# Patient Record
Sex: Female | Born: 1950 | Race: Black or African American | Hispanic: No | Marital: Single | Smoking: Never smoker
Health system: Southern US, Community
[De-identification: ages and names within clinical notes are randomized; demographics above are authoritative.]

## PROBLEM LIST (undated history)

## (undated) DIAGNOSIS — H579 Unspecified disorder of eye and adnexa: Secondary | ICD-10-CM

## (undated) DIAGNOSIS — I10 Essential (primary) hypertension: Secondary | ICD-10-CM

## (undated) DIAGNOSIS — E119 Type 2 diabetes mellitus without complications: Secondary | ICD-10-CM

## (undated) HISTORY — PX: KNEE SURGERY: SHX244

---

## 2015-07-23 ENCOUNTER — Emergency Department (INDEPENDENT_AMBULATORY_CARE_PROVIDER_SITE_OTHER)
Admission: EM | Admit: 2015-07-23 | Discharge: 2015-07-23 | Disposition: A | Discharge status: Home / Self Care | Payer: PRIVATE HEALTH INSURANCE | Source: Home / Self Care

## 2015-07-23 ENCOUNTER — Encounter (HOSPITAL_COMMUNITY): Payer: Self-pay | Admitting: *Deleted

## 2015-07-23 ENCOUNTER — Emergency Department (INDEPENDENT_AMBULATORY_CARE_PROVIDER_SITE_OTHER): Payer: PRIVATE HEALTH INSURANCE

## 2015-07-23 DIAGNOSIS — J0101 Acute recurrent maxillary sinusitis: Secondary | ICD-10-CM

## 2015-07-23 HISTORY — DX: Unspecified disorder of eye and adnexa: H57.9

## 2015-07-23 HISTORY — DX: Type 2 diabetes mellitus without complications: E11.9

## 2015-07-23 HISTORY — DX: Essential (primary) hypertension: I10

## 2015-07-23 MED ORDER — AZITHROMYCIN 250 MG PO TABS: ORAL_TABLET | ORAL | Status: AC

## 2015-07-23 MED ORDER — IPRATROPIUM BROMIDE 0.06 % NA SOLN: 2.0000 | Freq: Four times a day (QID) | NASAL | Status: AC

## 2015-07-23 MED ORDER — GUAIFENESIN-CODEINE 100-10 MG/5ML PO SYRP: 10.0000 mL | ORAL_SOLUTION | Freq: Four times a day (QID) | ORAL | Status: AC | PRN

## 2015-07-23 NOTE — Discharge Instructions (Signed)
Drink plenty of fluids as discussed, use medicine as prescribed, and mucinex or delsym for cough. Return or see your doctor if further problems °

## 2015-07-23 NOTE — ED Notes (Signed)
Pt  Reports    Symptoms  Of  Cough  /  Congestion       X  1  Week      Cough  Is  Become  More  Productive    Pt  Resides       In  Another     [facility]

## 2015-07-23 NOTE — ED Provider Notes (Signed)
CSN: [id number]     Arrival date & time 07/23/15  1527 History   None    Chief Complaint  Patient presents with  . URI   (Consider location/radiation/quality/duration/timing/severity/associated sxs/prior Treatment) Patient is a 64 y.o. female presenting with URI. The history is provided by the patient.  URI Presenting symptoms: congestion, cough and rhinorrhea   Presenting symptoms: no fever   Severity:  Moderate Onset quality:  Gradual Duration:  1 week Progression:  Unchanged (is from [place] , [place], usually gets abx and cough med.) Chronicity:  New Worsened by:  Nothing tried Ineffective treatments:  None tried Risk factors: recent travel   Risk factors: no sick contacts     Past Medical History  Diagnosis Date  . Hypertension   . Diabetes mellitus without complication (HCC)   . Eye disease    Past Surgical History  Procedure Laterality Date  . Knee surgery     History reviewed. No pertinent family history. Social History  Substance Use Topics  . Smoking status: Never Smoker   . Smokeless tobacco: None  . Alcohol Use: Yes   OB History    No data available     Review of Systems  Constitutional: Negative.  Negative for fever.  HENT: Positive for congestion, postnasal drip and rhinorrhea.   Respiratory: Positive for cough.   Cardiovascular: Negative.   Gastrointestinal: Negative.   All other systems reviewed and are negative.   Allergies  Review of patient's allergies indicates not on file.  Home Medications   Prior to Admission medications   Medication Sig Start Date End Date Taking? Authorizing Provider  azithromycin (ZITHROMAX Z-PAK) 250 MG tablet Take as directed on pack 07/23/15   Linna Hoff, MD  Brimonidine Tartrate-Timolol (COMBIGAN OP) Apply to eye.   Yes Historical Provider, MD  CLONAZEPAM PO Take by mouth.   Yes Historical Provider, MD  Furosemide (LASIX PO) Take by mouth.   Yes Historical Provider, MD  guaiFENesin-codeine (ROBITUSSIN AC)  100-10 MG/5ML syrup Take 10 mLs by mouth 4 (four) times daily as needed for cough. 07/23/15   Linna Hoff, MD  ipratropium (ATROVENT) 0.06 % nasal spray Place 2 sprays into both nostrils 4 (four) times daily. 07/23/15   Linna Hoff, MD  LOSARTAN POTASSIUM PO Take by mouth.   Yes Historical Provider, MD  METFORMIN HCL PO Take by mouth.   Yes Historical Provider, MD   Meds Ordered and Administered this Visit  Medications - No data to display  BP 159/81 mmHg  Pulse 62  Temp(Src) 97.8 F (36.6 C) (Oral)  Resp 16  SpO2 99% No data found.   Physical Exam  Constitutional: She is oriented to person, place, and time. She appears well-developed and well-nourished. No distress.  HENT:  Right Ear: External ear normal.  Left Ear: External ear normal.  Nose: Mucosal edema and rhinorrhea present. No sinus tenderness.  Mouth/Throat: Oropharynx is clear and moist.  Neck: Normal range of motion. Neck supple.  Cardiovascular: Normal rate and normal heart sounds.   Pulmonary/Chest: Effort normal and breath sounds normal. She has no rales.  Lymphadenopathy:    She has no cervical adenopathy.  Neurological: She is alert and oriented to person, place, and time.  Skin: Skin is warm and dry.  Nursing note and vitals reviewed.   ED Course  Procedures (including critical care time)  Labs Review Labs Reviewed - No data to display  Imaging Review Dg Chest 2 View  07/23/2015  CLINICAL DATA:  Congestion with productive cough. No fever. History of bronchitis and diabetes. Initial encounter. EXAM: CHEST  2 VIEW COMPARISON:  None. FINDINGS: The suboptimal inspiration on the lateral view. The heart size and mediastinal contours are normal. The lungs are clear. There is no pleural effusion or pneumothorax. No acute osseous findings are identified. Mild extrapleural fat deposition is noted symmetrically at both lung apices. IMPRESSION: No active cardiopulmonary process. Electronically Signed   By: Carey BullocksWilliam   Veazey M.D.   On: 07/23/2015 17:09   X-rays reviewed and report per radiologist.   Visual Acuity Review  Right Eye Distance:   Left Eye Distance:   Bilateral Distance:    Right Eye Near:   Left Eye Near:    Bilateral Near:         MDM   1. Acute recurrent maxillary sinusitis        Linna HoffJames D Kindl, MD 07/24/15 2012

## 2017-06-09 IMAGING — DX DG CHEST 2V
2 series · 2 of 2 positions shown · non-contrast
Comparison: None.

CLINICAL DATA: Congestion with productive cough. No fever. History
of bronchitis and diabetes. Initial encounter.

EXAM:
CHEST  2 VIEW

[chest pa]
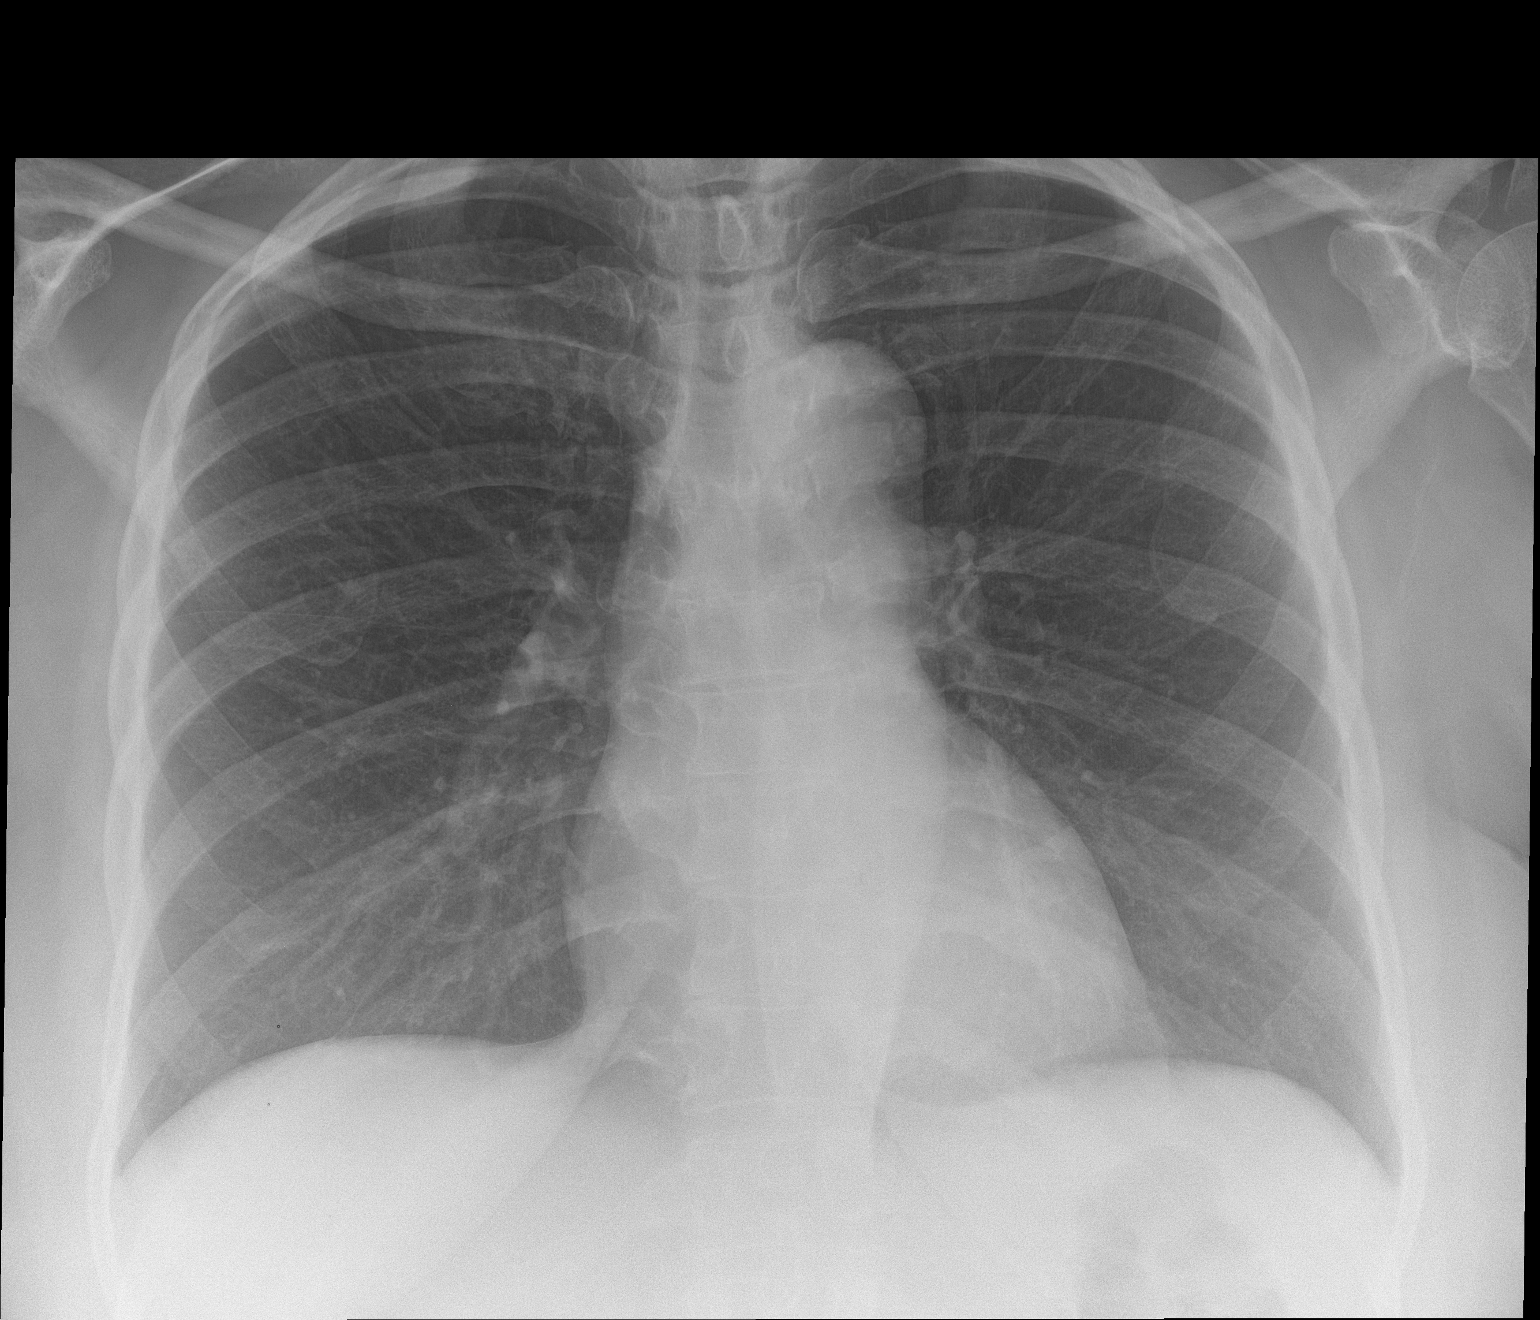

[chest lat]
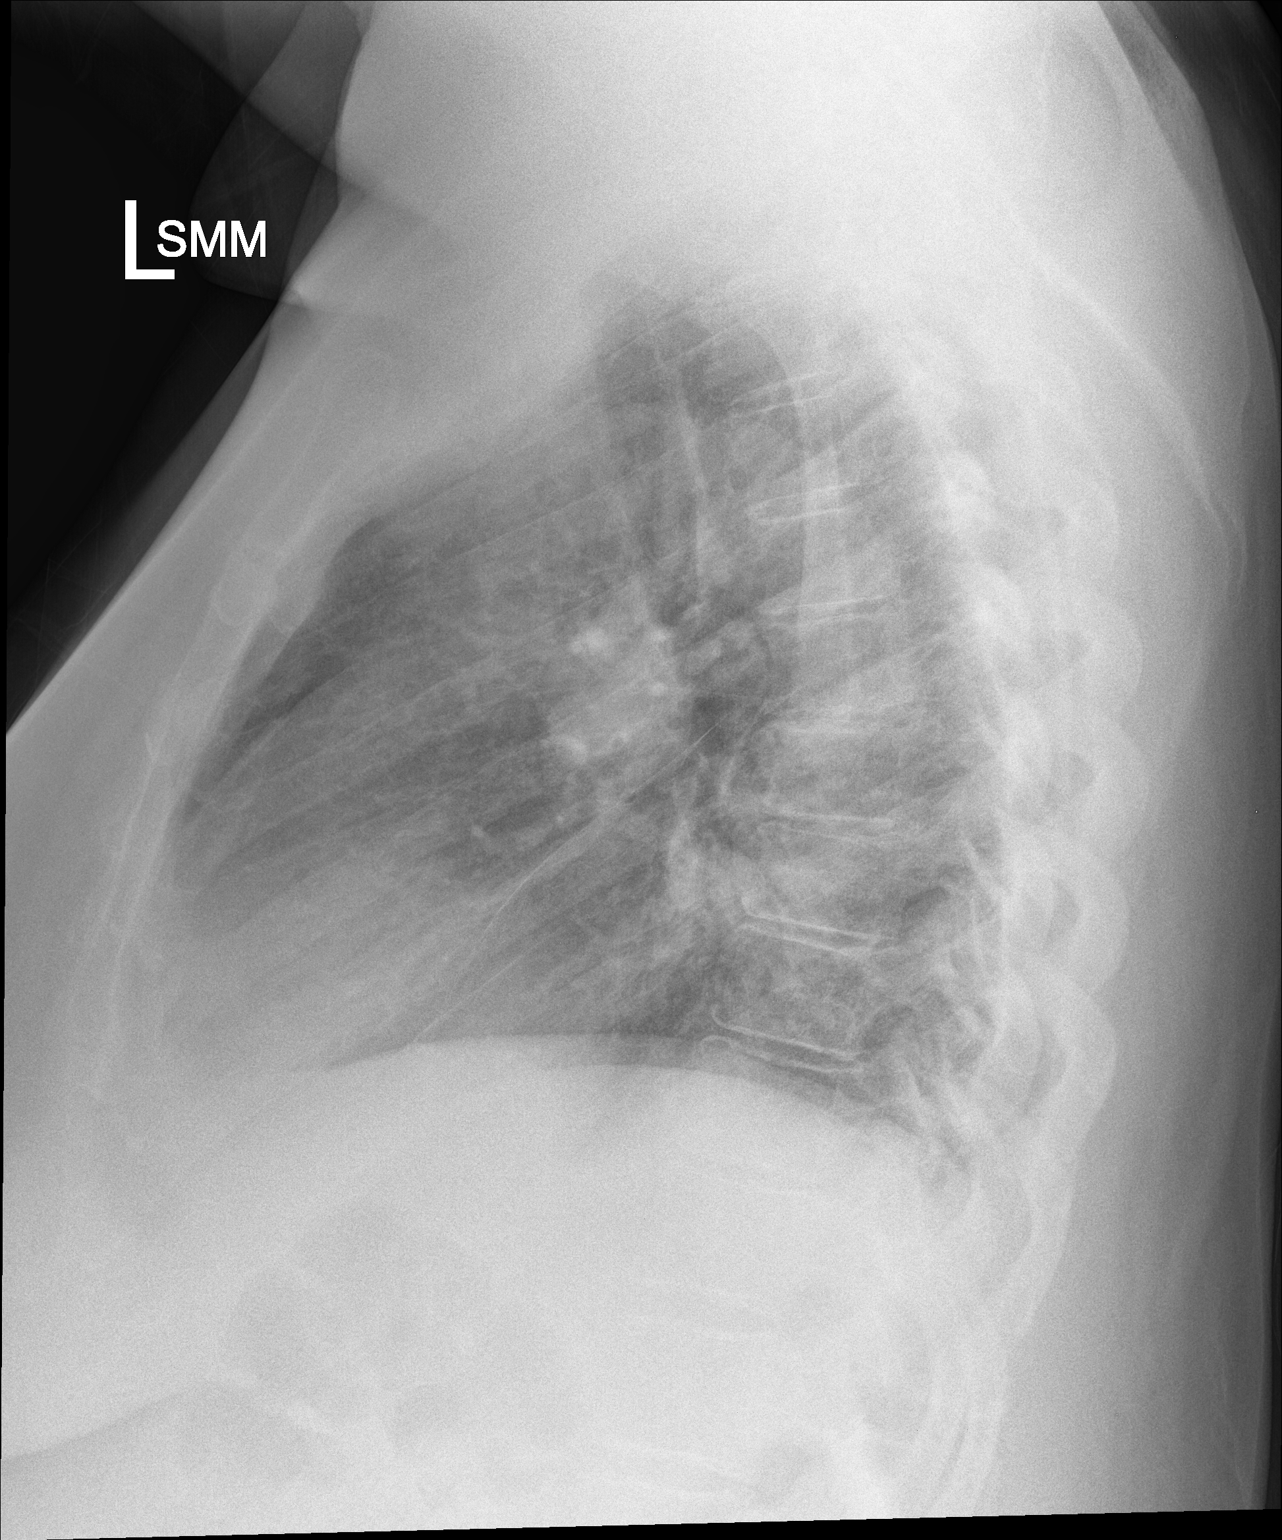

[2 of 2 positions shown; findings below may reference images not displayed]

FINDINGS: The suboptimal inspiration on the lateral view. The heart size and
mediastinal contours are normal. The lungs are clear. There is no
pleural effusion or pneumothorax. No acute osseous findings are
identified. Mild extrapleural fat deposition is noted symmetrically
at both lung apices.
IMPRESSION: No active cardiopulmonary process.
